# Patient Record
Sex: Female | Born: 1985 | Race: Black or African American | Hispanic: No | Marital: Single | Smoking: Never smoker
Health system: Southern US, Community
[De-identification: ages and names within clinical notes are randomized; demographics above are authoritative.]

---

## 2011-02-21 ENCOUNTER — Emergency Department (HOSPITAL_COMMUNITY)
Admission: EM | Admit: 2011-02-21 | Discharge: 2011-02-22 | Disposition: A | Discharge status: Home / Self Care | Payer: BC Managed Care – PPO | Attending: Emergency Medicine | Admitting: Emergency Medicine

## 2011-02-21 DIAGNOSIS — M7989 Other specified soft tissue disorders: Secondary | ICD-10-CM | POA: Insufficient documentation

## 2011-02-21 DIAGNOSIS — R209 Unspecified disturbances of skin sensation: Secondary | ICD-10-CM | POA: Insufficient documentation

## 2011-02-21 DIAGNOSIS — R51 Headache: Secondary | ICD-10-CM | POA: Insufficient documentation

## 2011-02-21 LAB — URINALYSIS, ROUTINE W REFLEX MICROSCOPIC
Ketones, ur: NEGATIVE mg/dL
Nitrite: NEGATIVE
Protein, ur: NEGATIVE mg/dL
Urobilinogen, UA: 0.2 mg/dL (ref 0.0–1.0)
pH: 7 (ref 5.0–8.0)

## 2011-02-21 LAB — POCT PREGNANCY, URINE: Preg Test, Ur: NEGATIVE

## 2011-10-28 ENCOUNTER — Ambulatory Visit (INDEPENDENT_AMBULATORY_CARE_PROVIDER_SITE_OTHER): Payer: BC Managed Care – PPO | Admitting: Family Medicine

## 2011-10-28 VITALS — BP 120/73 | HR 57 | Temp 98.2°F | Resp 16 | Ht 67.5 in | Wt 155.0 lb

## 2011-10-28 DIAGNOSIS — H1130 Conjunctival hemorrhage, unspecified eye: Secondary | ICD-10-CM

## 2011-10-28 DIAGNOSIS — S0990XA Unspecified injury of head, initial encounter: Secondary | ICD-10-CM

## 2011-10-28 NOTE — Patient Instructions (Signed)
Head Injury, Adult  You have had a head injury that does not appear serious at this time. A concussion is a state of changed mental ability, usually from a blow to the head. You should take clear liquids for the rest of the day and then resume your regular diet. You should not take sedatives or alcoholic beverages for as long as directed by your caregiver after discharge. After injuries such as yours, most problems occur within the first 24 hours.  SYMPTOMS  These minor symptoms may be experienced after discharge:  · Memory difficulties.  · Dizziness.  · Headaches.  · Double vision.  · Hearing difficulties.  · Depression.  · Tiredness.  · Weakness.  · Difficulty with concentration.  If you experience any of these problems, you should not be alarmed. A concussion requires a few days for recovery. Many patients with head injuries frequently experience such symptoms. Usually, these problems disappear without medical care. If symptoms last for more than one day, notify your caregiver. See your caregiver sooner if symptoms are becoming worse rather than better.  HOME CARE INSTRUCTIONS   · During the next 24 hours you must stay with someone who can watch you for the warning signs listed below.  Although it is unlikely that serious side effects will occur, you should be aware of signs and symptoms which may necessitate your return to this location. Side effects may occur up to 7 - 10 days following the injury. It is important for you to carefully monitor your condition and contact your caregiver or seek immediate medical attention if there is a change in your condition.  SEEK IMMEDIATE MEDICAL CARE IF:   · There is confusion or drowsiness.  · You can not awaken the injured person.  · There is nausea (feeling sick to your stomach) or continued, forceful vomiting.  · You notice dizziness or unsteadiness which is getting worse, or inability to walk.  · You have convulsions or unconsciousness.  · You experience severe,  persistent headaches not relieved by over-the-counter or prescription medicines for pain. (Do not take aspirin as this impairs clotting abilities). Take other pain medications only as directed.  · You can not use arms or legs normally.  · There is clear or bloody discharge from the nose or ears.  MAKE SURE YOU:   · Understand these instructions.  · Will watch your condition.  · Will get help right away if you are not doing well or get worse.  Document Released: 07/07/2005 Document Revised: 06/26/2011 Document Reviewed: 05/25/2009  [employer]® Patient Information ©2012 [employer].

## 2011-10-28 NOTE — Progress Notes (Signed)
Subjective: 5 days ago that patient was playing and seek in the dark and tripped over the She, catching her foot wear a cushion was out, and hitting her head on the couch. She had no loss of consciousness. Has not had any ongoing neurologic symptoms. She didn't even have that much pain at the time of the fall, but has persistently tender in the upper aspect of right orbit. She did get a little injury to her right, with little blood in the history of present illness. If  Objective: Alert oriented Afro-American female in no acute distress at this time. Her eyes are PERRLA a. Fundi benign. Discs flat. EOMs intact. She has mild proptosis. Is probably clot she injured the conjunctiva. Finger to nose normal. Coordination symmetrical. Romberg negative. She is tender on the right upper orbit, but nothing feels out of alignment.  Assessment: Closed head injury Some conjunctival hemorrhage  Plan: Reassurance. Cautioned her about any neurologic symptoms developing and she should return for further evaluation. I do not feel that x-rays or scans are indicated at this time can resume routine activity.

## 2013-06-27 ENCOUNTER — Ambulatory Visit (INDEPENDENT_AMBULATORY_CARE_PROVIDER_SITE_OTHER): Payer: BC Managed Care – PPO | Admitting: Emergency Medicine

## 2013-06-27 VITALS — BP 126/78 | HR 80 | Temp 98.6°F | Resp 12 | Ht 68.0 in | Wt 168.0 lb

## 2013-06-27 DIAGNOSIS — G44009 Cluster headache syndrome, unspecified, not intractable: Secondary | ICD-10-CM

## 2013-06-27 MED ORDER — TRAMADOL-ACETAMINOPHEN 37.5-325 MG PO TABS: 1.0000 | ORAL_TABLET | Freq: Four times a day (QID) | ORAL | Status: AC | PRN

## 2013-06-27 NOTE — Patient Instructions (Signed)
Cluster Headache Cluster headaches are recognized by their pattern of deep, intense head pain. They normally occur on one side of the head. Typically, cluster headaches:  Are severe in nature.  Occur repeatedly over weeks to months at a time and are then followed by periods of no headaches.  Can last 15 minutes to 3 hours.  Occur at the same time each day, often at night.  Occur several times a day. CAUSES The exact cause of a cluster headache is not known. Some things can trigger a cluster headache, such as:  Smoking.  Alcohol use.  Lack of sleep.  High altitude travel, such as airline travel.  Change of seasons.  Certain foods, such as foods or drinks that contain nitrates, glutamate, aspartame, or tyramine. SYMPTOMS   Severe pain that begins in or around the eye or temple.  One-sided head pain.  Feeling sick to your stomach (nauseous).  Sensitivity to light.  Runny nose.  Eye redness, tearing, and nasal stuffiness on the side of your head where you are experiencing pain.  Sweaty, pale skin of the face.  Droopy eyelid. DIAGNOSIS  Cluster headaches are diagnosed based on symptoms and physical exam. Your caregiver may order a computerized X-ray scan (CT or CAT scan) or a computerized magnetic scan (MRI) of your head or other lab tests to see if your headaches are caused from other medical conditions.  TREATMENT   Medications for pain relief and to prevent recurrent attacks.  Oxygen for pain relief.  Biofeedback programs may help reduce headache pain. Some people may need a combination of medicines for cluster headaches. It may be helpful to keep a headache diary. This may help you find a trend for what is triggering your headaches. Your caregiver can develop a treatment plan that can be helpful to you.  HOME CARE INSTRUCTIONS  During cluster periods:  Follow a regular sleep schedule.Do not vary the amount and time that you sleep from day to day. It is  important to stay on the same schedule during a cluster period to help prevent headaches.  Avoid alcohol.  Stop smoking. SEEK IMMEDIATE MEDICAL CARE IF:   You have any changes from your previous cluster headaches either in intensity or frequency.  You are not getting relief from medicines you are taking.  You faint.  You develop weakness or numbness, especially on one side of your body or face.  You develop double vision.  You develop nausea or vomiting.  You cannot keep your balance or have difficulty talking or walking.  You develop neck pain or neck stiffness.  You have a fever. Document Released: 07/07/2005 Document Revised: 09/29/2011 Document Reviewed: 01/27/2013 [facility] Patient Information 2014 [place], [place].
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
## 2013-07-01 ENCOUNTER — Ambulatory Visit
Admission: RE | Admit: 2013-07-01 | Discharge: 2013-07-01 | Disposition: A | Discharge status: Home / Self Care | Payer: BC Managed Care – PPO | Source: Ambulatory Visit | Attending: Emergency Medicine | Admitting: Emergency Medicine

## 2013-07-01 DIAGNOSIS — G44009 Cluster headache syndrome, unspecified, not intractable: Secondary | ICD-10-CM

## 2013-07-11 ENCOUNTER — Ambulatory Visit: Payer: BC Managed Care – PPO | Admitting: Neurology

## 2013-11-04 ENCOUNTER — Other Ambulatory Visit: Payer: Self-pay | Admitting: Physician Assistant

## 2013-11-04 ENCOUNTER — Other Ambulatory Visit (HOSPITAL_COMMUNITY)
Admission: RE | Admit: 2013-11-04 | Discharge: 2013-11-04 | Disposition: A | Discharge status: Home / Self Care | Payer: BC Managed Care – PPO | Source: Ambulatory Visit | Attending: Family Medicine | Admitting: Family Medicine

## 2013-11-04 DIAGNOSIS — Z124 Encounter for screening for malignant neoplasm of cervix: Secondary | ICD-10-CM | POA: Insufficient documentation

## 2014-09-27 ENCOUNTER — Emergency Department (HOSPITAL_COMMUNITY): Payer: BC Managed Care – PPO

## 2014-09-27 ENCOUNTER — Encounter (HOSPITAL_COMMUNITY): Payer: Self-pay | Admitting: Emergency Medicine

## 2014-09-27 ENCOUNTER — Emergency Department (HOSPITAL_COMMUNITY)
Admission: EM | Admit: 2014-09-27 | Discharge: 2014-09-27 | Disposition: A | Discharge status: Home / Self Care | Payer: BC Managed Care – PPO | Attending: Emergency Medicine | Admitting: Emergency Medicine

## 2014-09-27 DIAGNOSIS — R079 Chest pain, unspecified: Secondary | ICD-10-CM | POA: Diagnosis present

## 2014-09-27 DIAGNOSIS — Z8673 Personal history of transient ischemic attack (TIA), and cerebral infarction without residual deficits: Secondary | ICD-10-CM | POA: Diagnosis not present

## 2014-09-27 DIAGNOSIS — Z7982 Long term (current) use of aspirin: Secondary | ICD-10-CM | POA: Diagnosis not present

## 2014-09-27 DIAGNOSIS — Z8639 Personal history of other endocrine, nutritional and metabolic disease: Secondary | ICD-10-CM | POA: Diagnosis not present

## 2014-09-27 DIAGNOSIS — Z791 Long term (current) use of non-steroidal anti-inflammatories (NSAID): Secondary | ICD-10-CM | POA: Diagnosis not present

## 2014-09-27 LAB — CBC WITH DIFFERENTIAL/PLATELET
BASOS ABS: 0 10*3/uL (ref 0.0–0.1)
Basophils Relative: 0 % (ref 0–1)
EOS ABS: 0 10*3/uL (ref 0.0–0.7)
EOS PCT: 0 % (ref 0–5)
HCT: 38.1 % (ref 36.0–46.0)
HEMOGLOBIN: 12.4 g/dL (ref 12.0–15.0)
LYMPHS ABS: 1.5 10*3/uL (ref 0.7–4.0)
Lymphocytes Relative: 13 % (ref 12–46)
MCH: 27.3 pg (ref 26.0–34.0)
MCHC: 32.5 g/dL (ref 30.0–36.0)
MCV: 83.7 fL (ref 78.0–100.0)
MONO ABS: 0.7 10*3/uL (ref 0.1–1.0)
MONOS PCT: 6 % (ref 3–12)
NEUTROS PCT: 81 % — AB (ref 43–77)
Neutro Abs: 9 10*3/uL — ABNORMAL HIGH (ref 1.7–7.7)
Platelets: 320 10*3/uL (ref 150–400)
RBC: 4.55 MIL/uL (ref 3.87–5.11)
RDW: 13.2 % (ref 11.5–15.5)
WBC: 11.2 10*3/uL — AB (ref 4.0–10.5)

## 2014-09-27 LAB — BASIC METABOLIC PANEL
ANION GAP: 8 (ref 5–15)
BUN: 14 mg/dL (ref 6–23)
CALCIUM: 9.4 mg/dL (ref 8.4–10.5)
CO2: 23 mmol/L (ref 19–32)
CREATININE: 0.57 mg/dL (ref 0.50–1.10)
Chloride: 105 mmol/L (ref 96–112)
Glucose, Bld: 106 mg/dL — ABNORMAL HIGH (ref 70–99)
POTASSIUM: 3.4 mmol/L — AB (ref 3.5–5.1)
SODIUM: 136 mmol/L (ref 135–145)

## 2014-09-27 LAB — I-STAT TROPONIN, ED: Troponin i, poc: 0 ng/mL (ref 0.00–0.08)

## 2014-09-27 MED ORDER — NAPROXEN 500 MG PO TABS: 500.0000 mg | ORAL_TABLET | Freq: Two times a day (BID) | ORAL | Status: AC

## 2014-09-27 NOTE — Discharge Instructions (Signed)

## 2014-09-27 NOTE — ED Provider Notes (Signed)
CSN: [id number]     Arrival date & time 09/27/14  1118 History   First MD Initiated Contact with Patient 09/27/14 1529     Chief Complaint  Patient presents with  . sent from urgent care rule out PE      (Consider location/radiation/quality/duration/timing/severity/associated sxs/prior Treatment) HPI Wendy Kennedy is a 29 y.o. female who comes in from urgent care center for evaluation of "blood clots in her lungs". Patient states this morning at 4:00 AM she began to experience a constant, sharp, burning sensation in her left chest that has migrated to her right chest and into her right shoulder throughout the day. The pain is rated as a 5/10. This pain is exacerbated with certain movements. Nothing makes the pain better. She has not taken anything to improve her symptoms. She denies any chest discomfort now. No pleuritic component. Patient states she is typically very active, works out 3 or 4 times a week and does not experience this discomfort following strenuous exercise. She denies fevers, nausea or vomiting, shortness of breath, leg swelling, recent travels or surgeries, exogenous estrogen use, personal history of cancer, hemoptysis. Patient is a nonsmoker  History reviewed. No pertinent past medical history. History reviewed. No pertinent past surgical history. Family History  Problem Relation Age of Onset  . Hyperlipidemia Mother   . Stroke Father    History  Substance Use Topics  . Smoking status: Never Smoker   . Smokeless tobacco: Not on file  . Alcohol Use: No   OB History    No data available     Review of Systems A 10 point review of systems was completed and was negative except for pertinent positives and negatives as mentioned in the history of present illness     Allergies  Review of patient's allergies indicates no known allergies.  Home Medications   Prior to Admission medications   Medication Sig Start Date End Date Taking? Authorizing Provider  aspirin 325  MG tablet Take 325 mg by mouth daily as needed for mild pain or headache.   Yes Historical Provider, MD  naproxen (NAPROSYN) 500 MG tablet Take 1 tablet (500 mg total) by mouth 2 (two) times daily. 09/27/14   Joycie Peek, PA-C  traMADol-acetaminophen (ULTRACET) 37.5-325 MG per tablet Take 1-2 tablets by mouth every 6 (six) hours as needed. 06/27/13   Carmelina Dane, MD   BP 124/71 mmHg  Pulse 87  Temp(Src) 97.8 F (36.6 C) (Oral)  Resp 20  SpO2 100%  LMP 09/11/2014 Physical Exam  Constitutional: She is oriented to person, place, and time. She appears well-developed and well-nourished.  HENT:  Head: Normocephalic and atraumatic.  Mouth/Throat: Oropharynx is clear and moist.  Eyes: Conjunctivae are normal. Pupils are equal, round, and reactive to light. Right eye exhibits no discharge. Left eye exhibits no discharge. No scleral icterus.  Neck: Normal range of motion. Neck supple.  Cardiovascular: Normal rate, regular rhythm, normal heart sounds and intact distal pulses.  Exam reveals no gallop and no friction rub.   No murmur heard. Pulmonary/Chest: Effort normal and breath sounds normal. No respiratory distress. She has no wheezes. She has no rales.  Mild tenderness in intercostal spaces of right chest. No other lesions or deformities noted.  Abdominal: Soft. There is no tenderness.  Musculoskeletal: Normal range of motion. She exhibits no edema or tenderness.  No unilateral leg swelling, erythema.  Neurological: She is alert and oriented to person, place, and time.  Cranial Nerves II-XII grossly intact  Skin: Skin is warm and dry. No rash noted.  Psychiatric: She has a normal mood and affect.  Nursing note and vitals reviewed.   ED Course  Procedures (including critical care time) Labs Review Labs Reviewed  CBC WITH DIFFERENTIAL/PLATELET - Abnormal; Notable for the following:    WBC 11.2 (*)    Neutrophils Relative % 81 (*)    Neutro Abs 9.0 (*)    All other components  within normal limits  BASIC METABOLIC PANEL - Abnormal; Notable for the following:    Potassium 3.4 (*)    Glucose, Bld 106 (*)    All other components within normal limits  I-STAT TROPOININ, ED  Rosezena SensorI-STAT TROPOININ, ED    Imaging Review Dg Chest 2 View  09/27/2014   CLINICAL DATA:  Chest pain  EXAM: CHEST  2 VIEW  COMPARISON:  None.  FINDINGS: Cardiomediastinal silhouette is unremarkable. No acute infiltrate or pleural effusion. No pulmonary edema. Dextroscoliosis of thoracic spine.  IMPRESSION: No active cardiopulmonary disease.  Thoracic dextroscoliosis.   Electronically Signed   By: Natasha MeadLiviu  Pop M.D.   On: 09/27/2014 16:18     EKG Interpretation   Date/Time:  Wednesday September 27 2014 15:58:47 EST Ventricular Rate:  80 PR Interval:  135 QRS Duration: 83 QT Interval:  359 QTC Calculation: 414 R Axis:   23 Text Interpretation:  Sinus rhythm Borderline T abnormalities, anterior  leads No old tracing to compare Confirmed by BELFI  MD, MELANIE ([id number]) on  09/27/2014 4:08:04 PM     Meds given in ED:  Medications - No data to display  New Prescriptions   NAPROXEN (NAPROSYN) 500 MG TABLET    Take 1 tablet (500 mg total) by mouth 2 (two) times daily.   Filed Vitals:   09/27/14 1123  BP: 124/71  Pulse: 87  Temp: 97.8 F (36.6 C)  TempSrc: Oral  Resp: 20  SpO2: 100%     MDM  Vitals stable - WNL -afebrile Pt resting comfortably in ED. Denies any discomfort at this time. PE--physical exam is unremarkable. Labwork--noncontributory. Nonspecific leukocytosis. Troponin negative, EKG with nonspecific T-wave inversions. Imaging--chest x-ray shows no acute cardio pulmonary pathology.  DDX--patient is PERC negative, also Wells DVT criteria -2. Clinical picture not consistent with ACS--heart score 1. Doubt pericarditis/myocarditis. No evidence of other acute or emergent pathology at this time. Discomfort has been constant and ongoing since 4:00 this morning, I do not feel it is necessary  to cycle troponins at this time. Will DC with anti-inflammatories and instructions to follow-up with PCP. I discussed all relevant lab findings and imaging results with pt and they verbalized understanding. Discussed f/u with PCP within 48 hrs and return precautions, pt very amenable to plan.  Final diagnoses:  Chest pain, unspecified chest pain type        Joycie PeekBenjamin Arianah Torgeson, PA-C 09/28/14 1125  Rolan BuccoMelanie Belfi, MD 10/01/14 1746

## 2014-09-27 NOTE — ED Notes (Addendum)
Pt c/o chest pain that started this morning, sent over by urgent care to rule out blood clot, no hx of. denies SOB. Pt had EKG done at urgent care before coming.

## 2015-07-16 IMAGING — CT CT HEAD W/O CM
2 series · 16 of 30 positions shown, 20 images · non-contrast
Comparison: None

CLINICAL DATA: Left-sided headaches. Numbness and weakness in left
arm and hand.

EXAM:
CT HEAD WITHOUT CONTRAST
TECHNIQUE: Contiguous axial images were obtained from the base of the skull
through the vertex without contrast.

[Series 3: head w/o · axial · non-contrast · 0.49mm/px · z∈[+6,+132]mm · 13 of 28 slices shown, 17 images]
[im 2/28  brain]
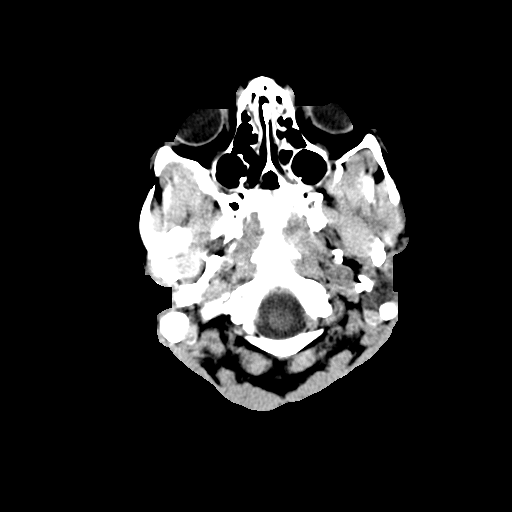
[im 2/28  bone]
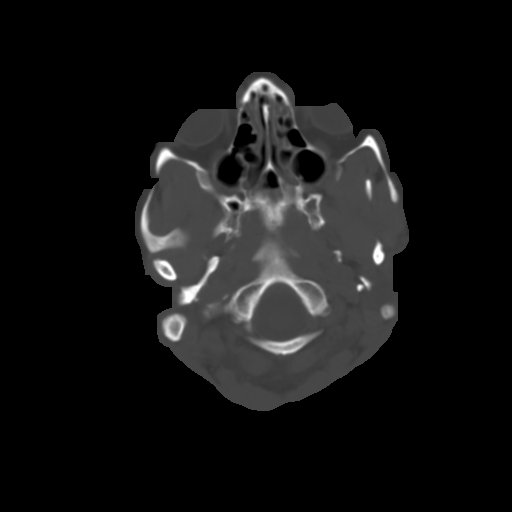
[im 4/28  brain]
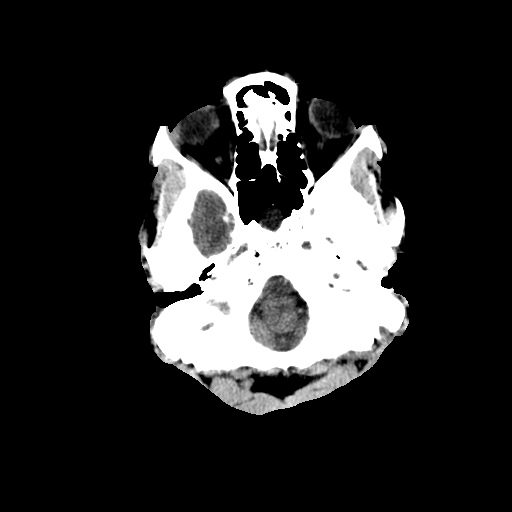
[im 6/28  brain]
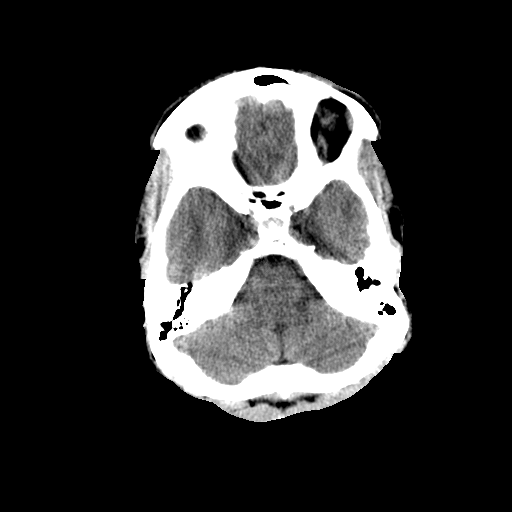
[im 8/28  brain]
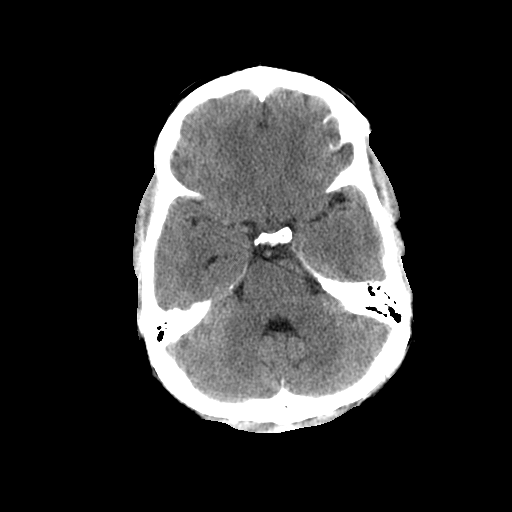
[im 10/28  brain]
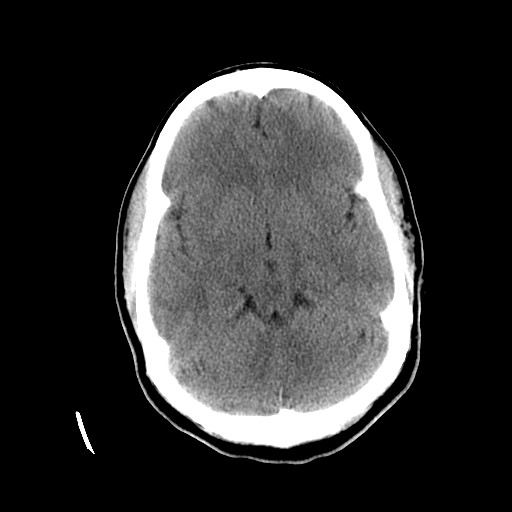
[im 10/28  bone]
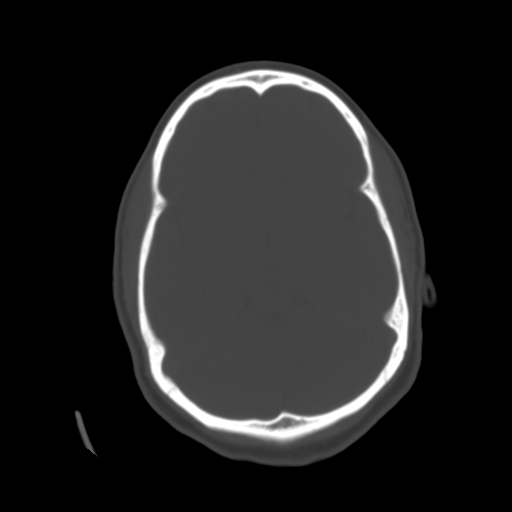
[im 12/28  brain]
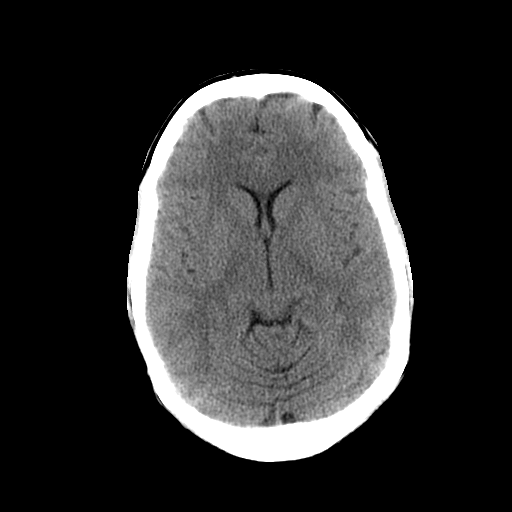
[im 14/28  brain]
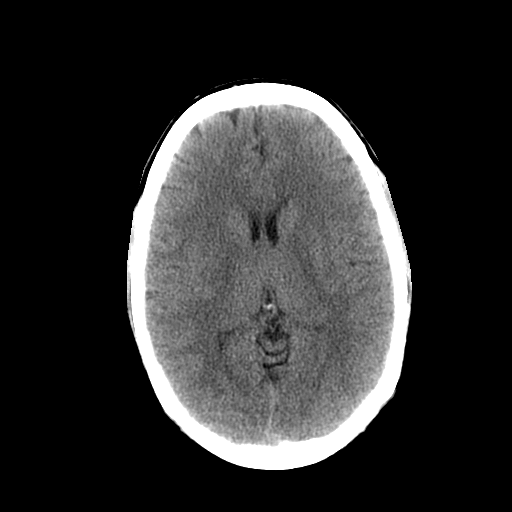
[im 16/28  brain]
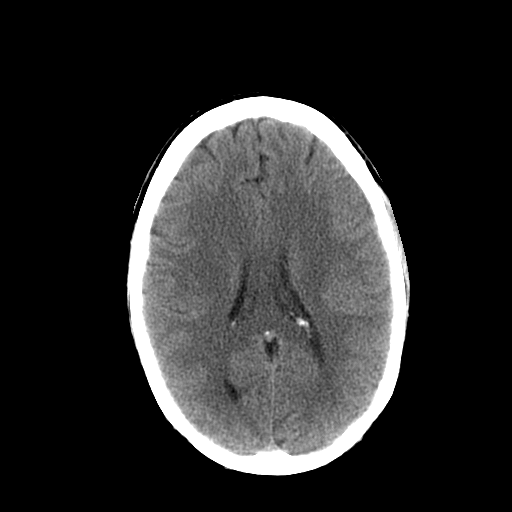
[im 18/28  brain]
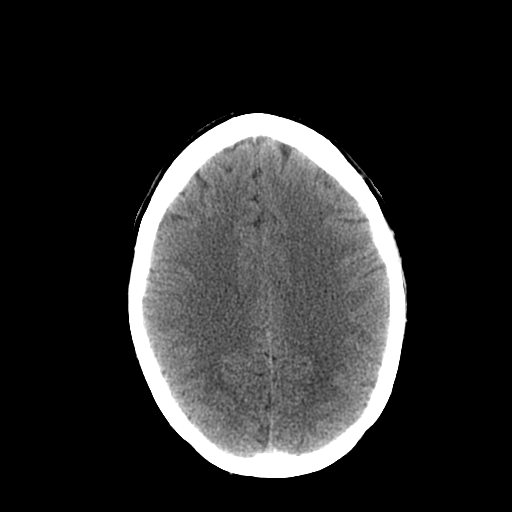
[im 18/28  bone]
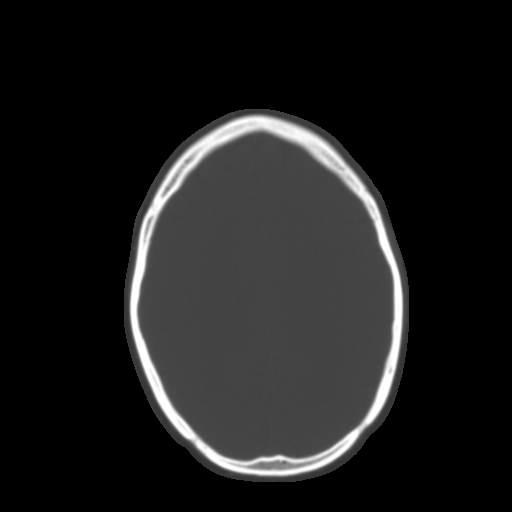
[im 20/28  brain]
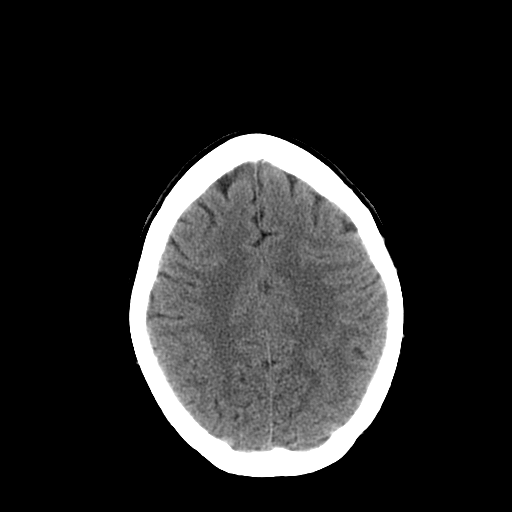
[im 22/28  brain]
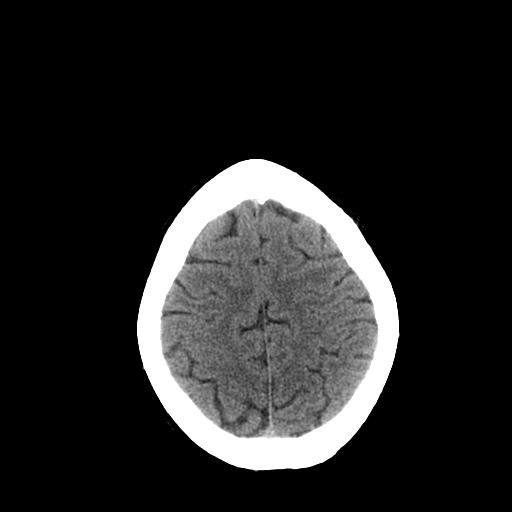
[im 24/28  brain]
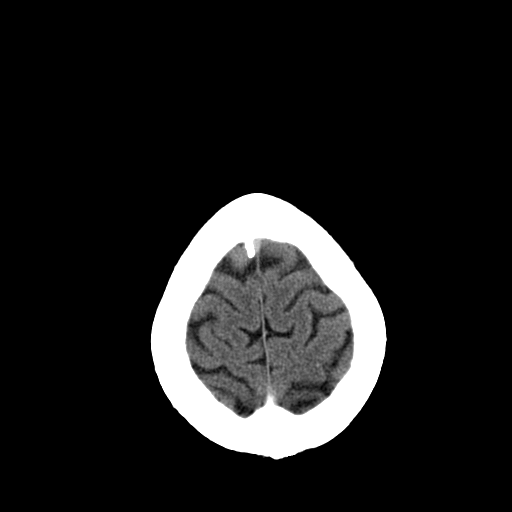
[im 26/28  brain]
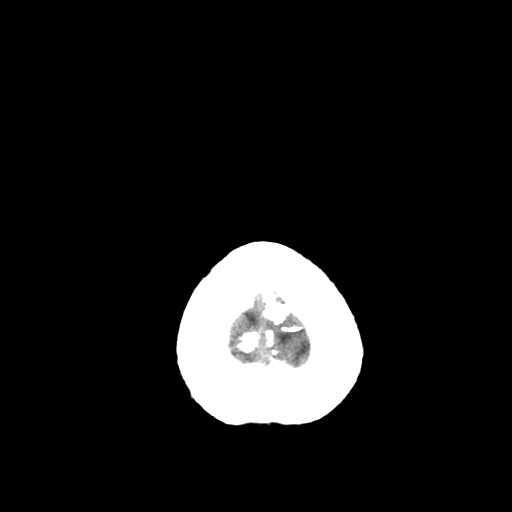
[im 26/28  bone]
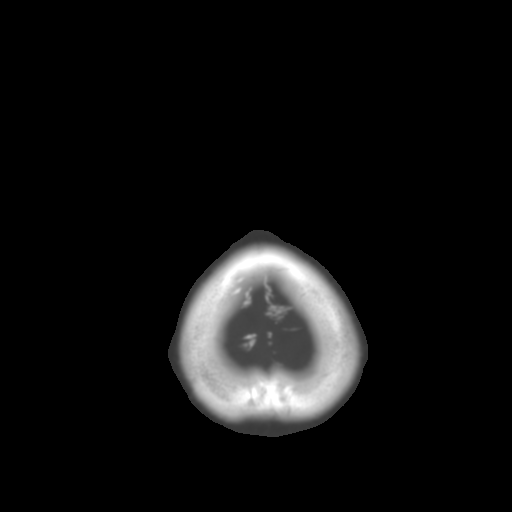

[Series 4: head bone · axial · 0.49mm/px · z∈[+6,+48]mm · 3 of 28 slices shown]
[im 2/28  bone]
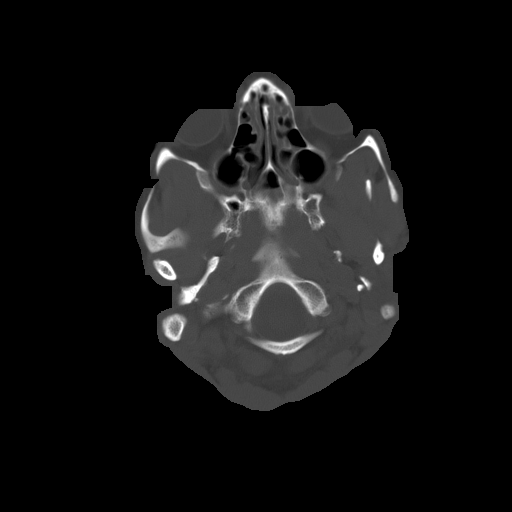
[im 6/28  bone]
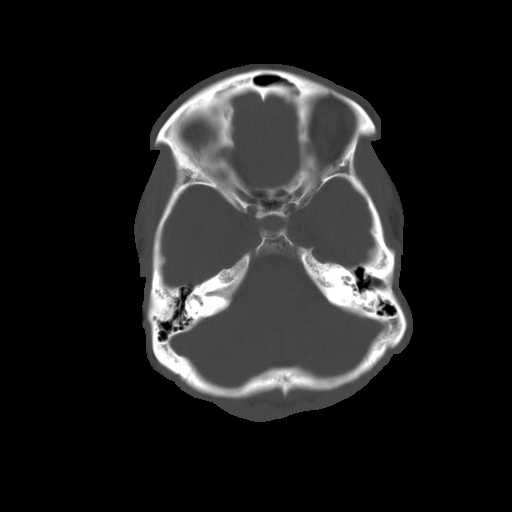
[im 10/28  bone]
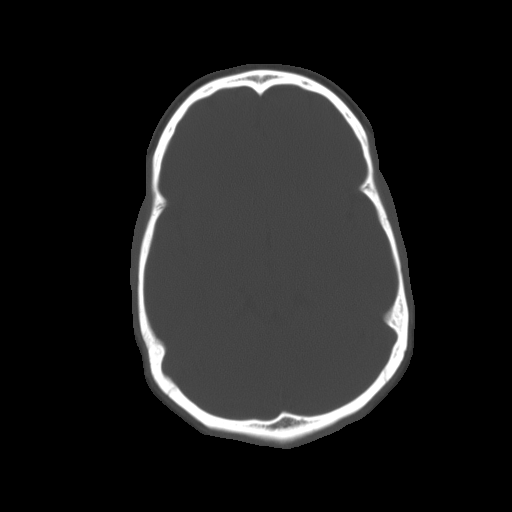

[16 of 30 positions shown; findings below may reference images not displayed]

FINDINGS: Normal appearance of the intracranial structures. No evidence for
acute hemorrhage, mass lesion, midline shift, hydrocephalus or large
infarct. No acute bony abnormality. The visualized sinuses are
clear.
IMPRESSION: No acute intracranial abnormality.

## 2016-10-11 IMAGING — CR DG CHEST 2V
2 series · 2 of 2 positions shown · non-contrast
Comparison: None.

CLINICAL DATA: Chest pain

EXAM:
CHEST  2 VIEW

[w chest pa]
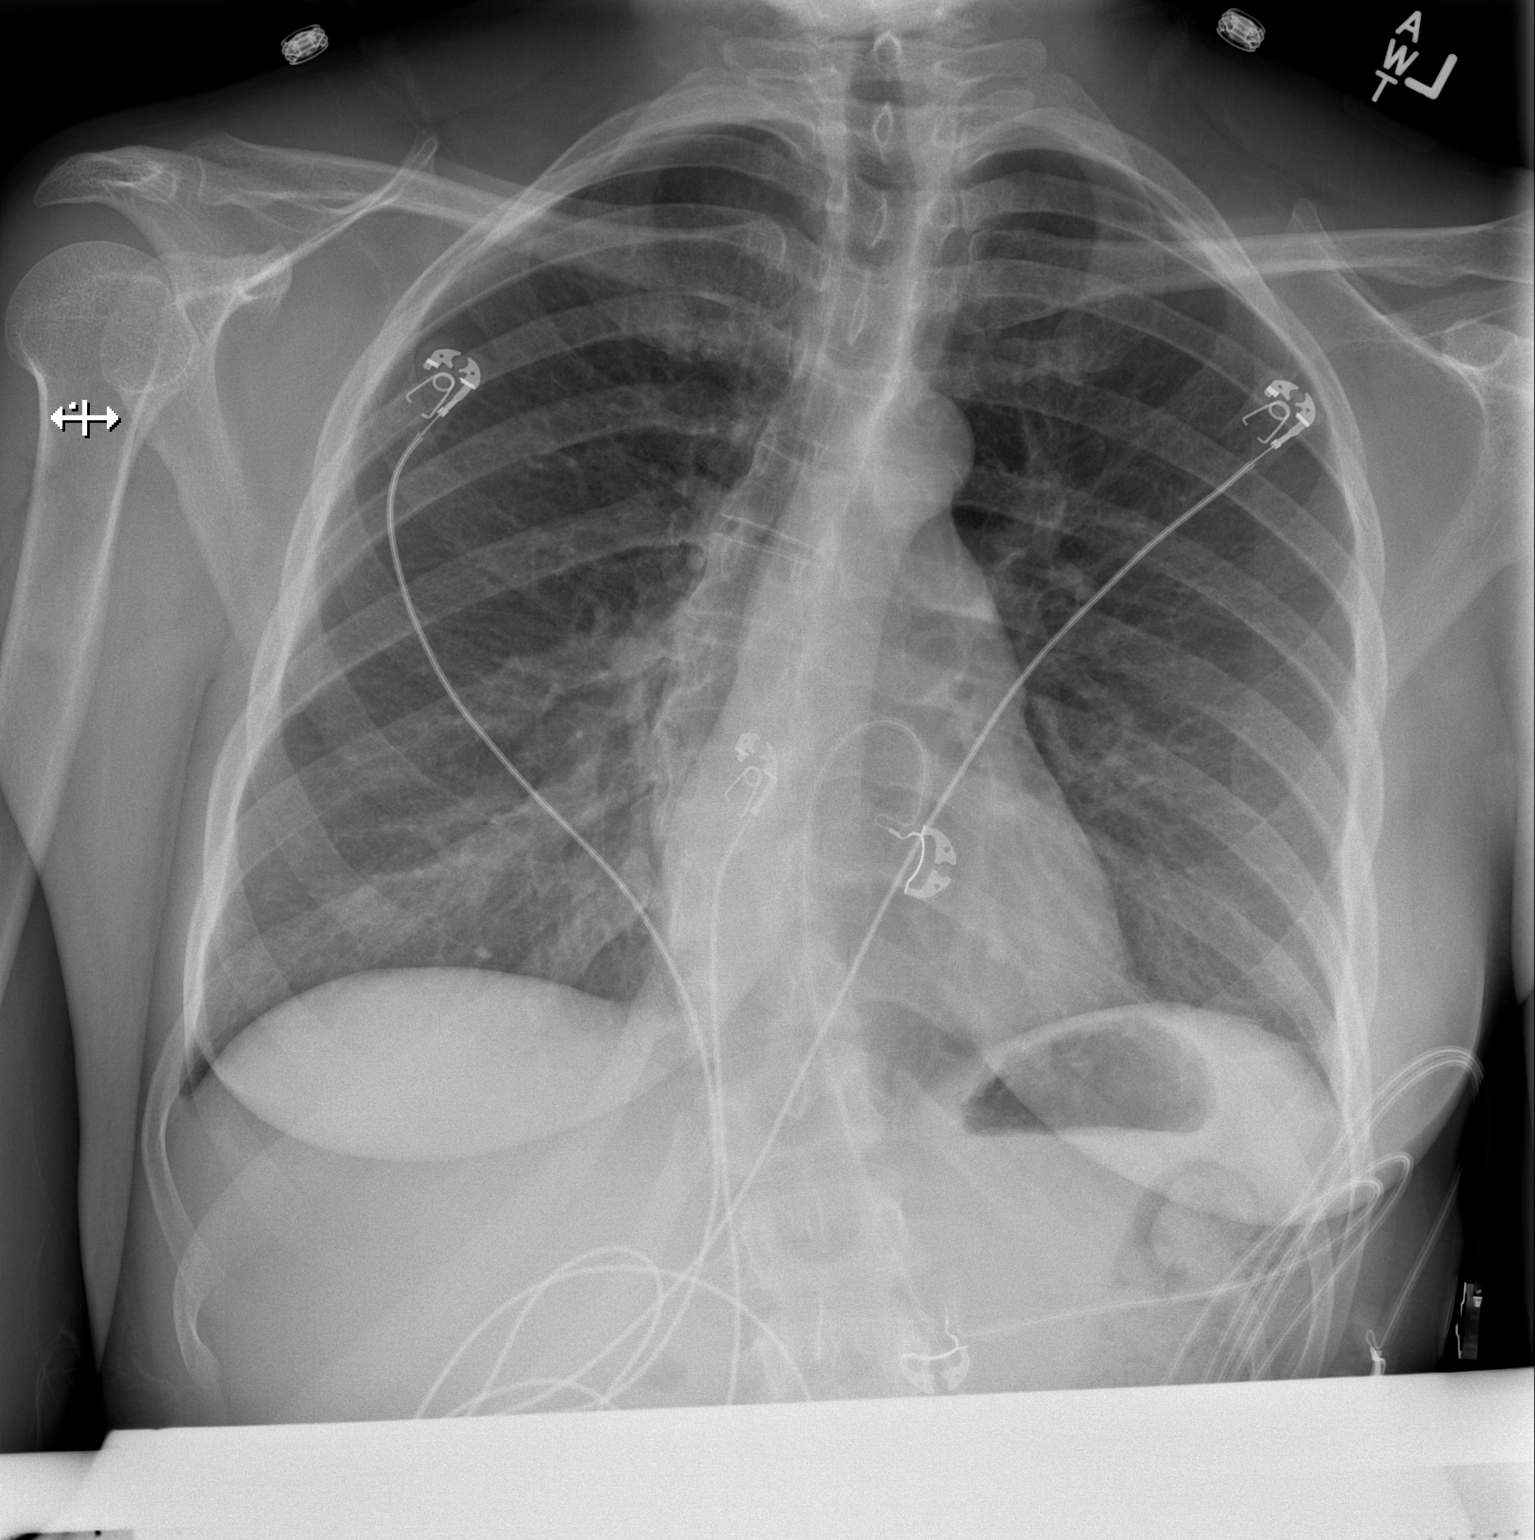

[w chest lat]
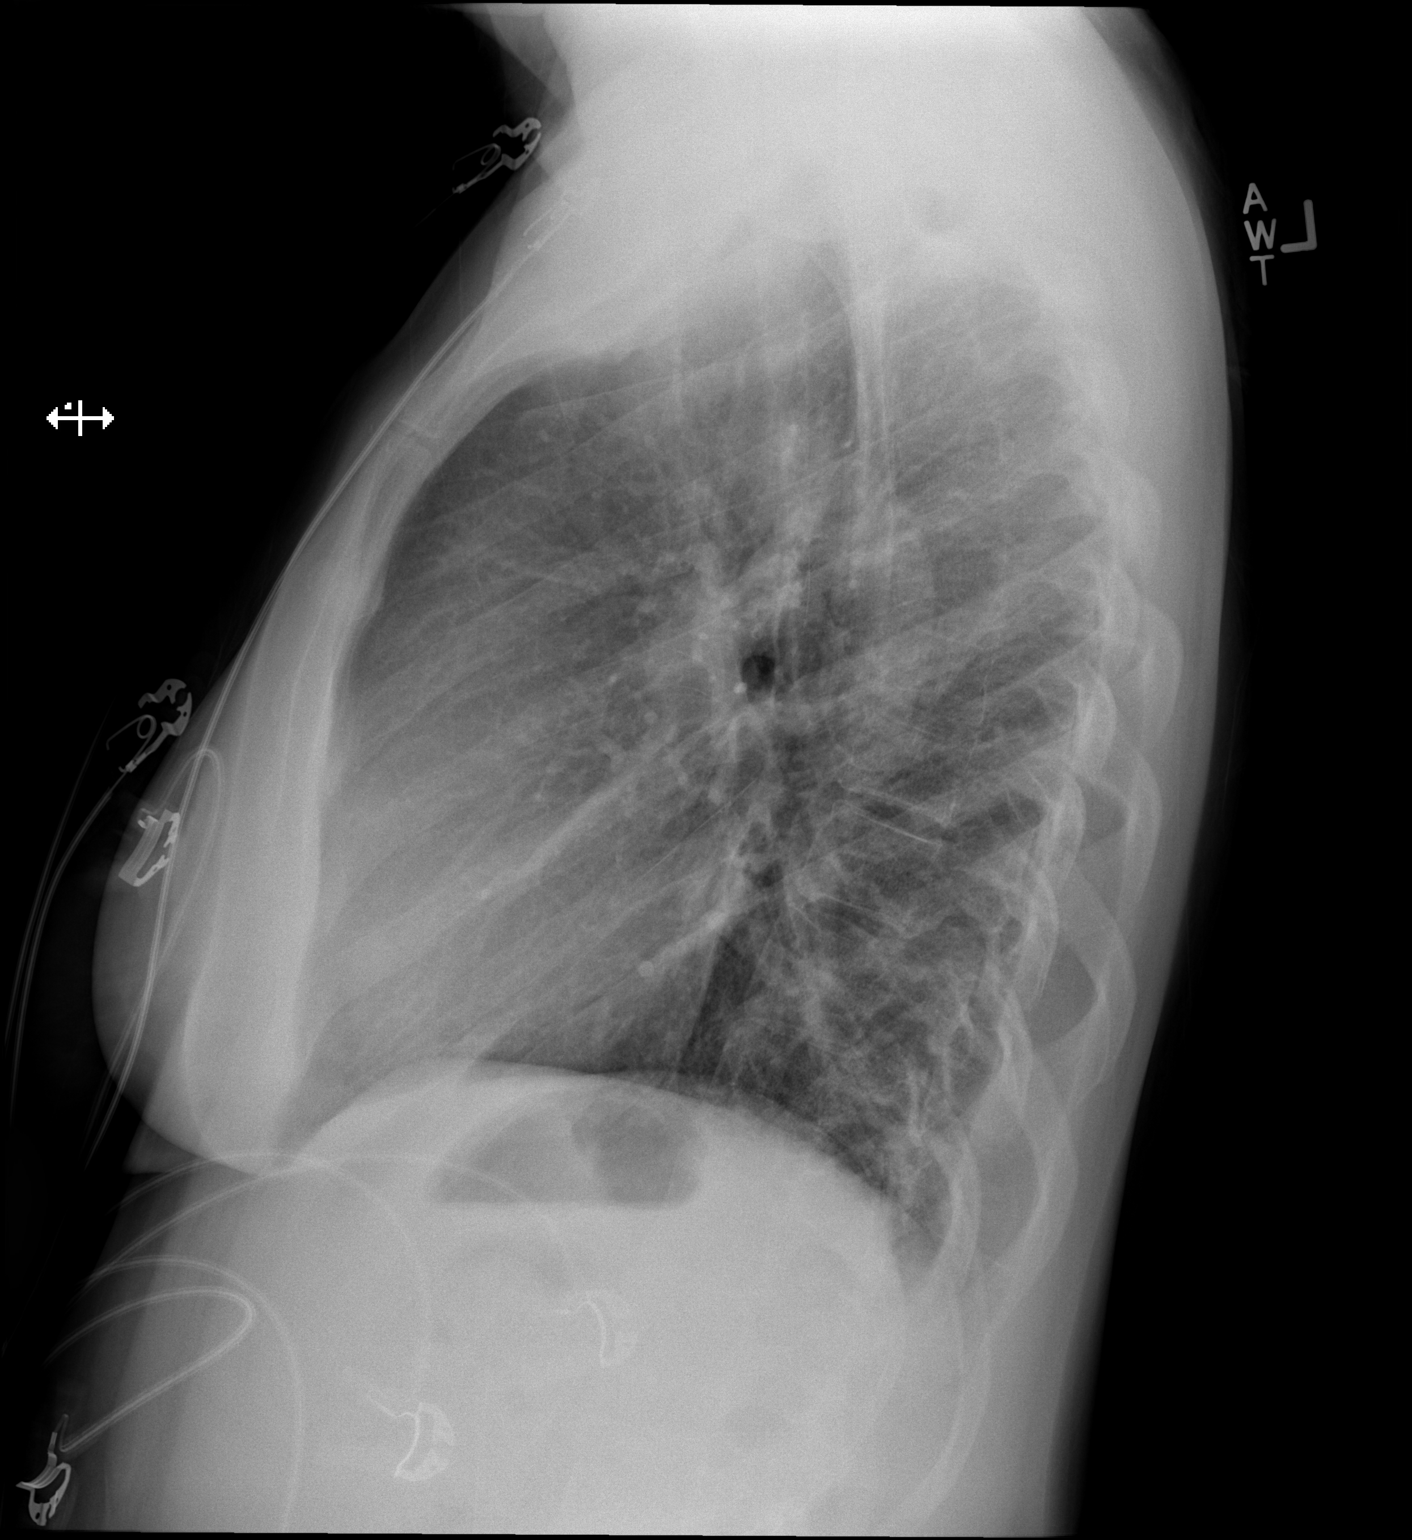

[2 of 2 positions shown; findings below may reference images not displayed]

FINDINGS: Cardiomediastinal silhouette is unremarkable. No acute infiltrate or
pleural effusion. No pulmonary edema. Dextroscoliosis of thoracic
spine.
IMPRESSION: No active cardiopulmonary disease.  Thoracic dextroscoliosis.

## 2023-12-24 ENCOUNTER — Ambulatory Visit: Admitting: Family Medicine

## 2024-04-07 ENCOUNTER — Other Ambulatory Visit: Payer: Self-pay | Admitting: Family Medicine

## 2024-04-07 DIAGNOSIS — Z1231 Encounter for screening mammogram for malignant neoplasm of breast: Secondary | ICD-10-CM
